# Patient Record
Sex: Female | Born: 1955 | Race: Black or African American | Hispanic: No | Marital: Married | Smoking: Never smoker
Health system: Southern US, Community
[De-identification: ages and names within clinical notes are randomized; demographics above are authoritative.]

## PROBLEM LIST (undated history)

## (undated) DIAGNOSIS — I10 Essential (primary) hypertension: Secondary | ICD-10-CM

## (undated) DIAGNOSIS — E119 Type 2 diabetes mellitus without complications: Secondary | ICD-10-CM

## (undated) HISTORY — PX: TUBAL LIGATION: SHX77

---

## 2012-03-07 ENCOUNTER — Encounter: Payer: Self-pay | Admitting: Emergency Medicine

## 2012-03-07 ENCOUNTER — Emergency Department (INDEPENDENT_AMBULATORY_CARE_PROVIDER_SITE_OTHER)
Admission: EM | Admit: 2012-03-07 | Discharge: 2012-03-07 | Disposition: A | Discharge status: Home / Self Care | Payer: PRIVATE HEALTH INSURANCE | Source: Home / Self Care | Attending: Emergency Medicine | Admitting: Emergency Medicine

## 2012-03-07 DIAGNOSIS — M6530 Trigger finger, unspecified finger: Secondary | ICD-10-CM

## 2012-03-07 HISTORY — DX: Essential (primary) hypertension: I10

## 2012-03-07 MED ORDER — TRAMADOL HCL 50 MG PO TABS: 50.0000 mg | ORAL_TABLET | Freq: Four times a day (QID) | ORAL | Status: AC | PRN

## 2012-03-07 NOTE — ED Notes (Signed)
Right thumb pain started about 6 weeks ago; splint given; worsened over past 24 hours.

## 2012-03-07 NOTE — ED Provider Notes (Signed)
History     CSN: [id number]  Arrival date & time 03/07/12  1730   First MD Initiated Contact with Patient 03/07/12 1744      No chief complaint on file.   (Consider location/radiation/quality/duration/timing/severity/associated sxs/prior treatment) HPI This is a right-handed female patient who presents today with right thumb pain.  She saw a physician about a month ago gave her meloxicam and she's been taking it every day without any success.  She states pain is on the right thumb and sometimes feels that it locks up.  No trauma.  None of her other fingers are affected.  She works for a [employer] and frequently does typing all day long.  She states that her discomfort is intermittent especially when bending her thumb in certain directions and is mild to moderate in severity.  No past medical history on file.  No past surgical history on file.  No family history on file.  History  Substance Use Topics  . Smoking status: Not on file  . Smokeless tobacco: Not on file  . Alcohol Use: Not on file    OB History    No data available      Review of Systems  All other systems reviewed and are negative.    Allergies  Review of patient's allergies indicates not on file.  Home Medications   Current Outpatient Rx  Name Route Sig Dispense Refill  . TRAMADOL HCL 50 MG PO TABS Oral Take 1 tablet (50 mg total) by mouth every 6 (six) hours as needed for pain. 24 tablet 0    There were no vitals taken for this visit.  Physical Exam  Nursing note and vitals reviewed. Constitutional: She is oriented to person, place, and time. She appears well-developed and well-nourished.  HENT:  Head: Normocephalic and atraumatic.  Eyes: No scleral icterus.  Neck: Neck supple.  Cardiovascular: Regular rhythm and normal heart sounds.   Pulmonary/Chest: Effort normal and breath sounds normal. No respiratory distress.  Musculoskeletal:       Right hand and wrist examination demonstrates full range  of motion of all her digits and a normal distal neurovascular status.  Thumb examination shows full range of motion of the IP joint MCP joint and CMC joint.  No snuffbox tenderness.  When flexing the thumb she does have a small trigger point located near the MCP joint on the volar aspect of her thumb.  No swelling or bruising.  No UCL laxity.  Neurological: She is alert and oriented to person, place, and time.  Skin: Skin is warm and dry.  Psychiatric: She has a normal mood and affect. Her speech is normal.    ED Course  Procedures (including critical care time)  Labs Reviewed - No data to display No results found.   1. Trigger finger       MDM   This patient apparently has trigger thumb and I gave her a prescription for tramadol and was going to give her a thumb spica splint but she already had one on her person.  I advised her to ice the area and use her splint for the next few days.  I've sent her to Dr. Pearletha Forge who is a sports medicine physician who can do an ultrasound guided injection into the trigger point.  That should help her more than anything else.  I explained the pathophysiology to the patient and she understands it.  Marlaine Hind, MD 03/07/12 1757

## 2012-03-10 ENCOUNTER — Encounter: Payer: Self-pay | Admitting: Family Medicine

## 2012-03-10 ENCOUNTER — Ambulatory Visit (INDEPENDENT_AMBULATORY_CARE_PROVIDER_SITE_OTHER): Payer: PRIVATE HEALTH INSURANCE | Admitting: Family Medicine

## 2012-03-10 VITALS — BP 126/83 | HR 88 | Temp 98.0°F | Ht 66.0 in | Wt 161.0 lb

## 2012-03-10 DIAGNOSIS — M6530 Trigger finger, unspecified finger: Secondary | ICD-10-CM

## 2012-03-10 NOTE — Assessment & Plan Note (Signed)
Right 1st digit trigger finger - patient has failed bracing to this point, becoming more painful.  Discussed options and went ahead with cortisone injection today.  See instructions for further.  F/u in 1 month for reevaluation.  Consider repeat injection if not improving.  After informed written consent patient was seated on exam table.  Area overlying 1st A1 pulley prepped with alcohol swab and nodule injected with 0.5:0.5 mL marcaine: depomedrol.  Patient tolerated procedure well without immediate complications.

## 2012-03-10 NOTE — Progress Notes (Signed)
  Subjective:    Patient ID: Tammy Joseph, female    DOB: 23-Aug-1956, 56 y.o.   MRN: [id number]  PCP: Dr. Gildardo Griffes  HPI 56 yo F here for right thumb pain.  Patient denies known injury. States for over 2 months has had progressively worsening right thumb pain, stiffness, and catching with motions of IP joint. Does a lot of typing, using hands for work. Is right handed. Has been wearing a thumb spica like brace since March 20th. Went to urgent care recently and referred here. No prior issues with this thumb.  Past Medical History  Diagnosis Date  . Hypertension     Current Outpatient Prescriptions on File Prior to Visit  Medication Sig Dispense Refill  . hydrochlorothiazide (MICROZIDE) 12.5 MG capsule Take by mouth daily.      Marland Kitchen levothyroxine (SYNTHROID, LEVOTHROID) 88 MCG tablet Take 88 mcg by mouth daily.      . meloxicam (MOBIC) 15 MG tablet Take 15 mg by mouth daily.      . traMADol (ULTRAM) 50 MG tablet Take 1 tablet (50 mg total) by mouth every 6 (six) hours as needed for pain.  24 tablet  0    Past Surgical History  Procedure Date  . Tubal ligation     Allergies  Allergen Reactions  . Potassium-Containing Compounds     History   Social History  . Marital Status: Married    Spouse Name: N/A    Number of Children: N/A  . Years of Education: N/A   Occupational History  . Not on file.   Social History Main Topics  . Smoking status: Never Smoker   . Smokeless tobacco: Not on file  . Alcohol Use: No  . Drug Use: No  . Sexually Active:    Other Topics Concern  . Not on file   Social History Narrative  . No narrative on file    Family History  Problem Relation Age of Onset  . Hypertension Sister   . Hypertension Brother   . Heart failure Brother   . Heart attack Brother   . Hyperlipidemia Brother   . Diabetes Maternal Aunt   . Sudden death Neg Hx     BP 126/83  Pulse 88  Temp(Src) 98 F (36.7 C) (Oral)  Ht 5\' 6"  (1.676 m)  Wt  161 lb (73.029 kg)  BMI 25.99 kg/m2  Review of Systems See HPI above.    Objective:   Physical Exam Gen: NAD  R hand: Palpable nodule at A1 pulley location of 1st digit.  No other swelling, deformity.  No malrotation or angulation of thumb. TTP at nodule noted above.  No other TTP throughout digits. Painful flexion of IP joint but full extension. Collateral ligaments intact of 1st MCP and IP joint. NVI distally.     Assessment & Plan:  1. Right 1st digit trigger finger - patient has failed bracing to this point, becoming more painful.  Discussed options and went ahead with cortisone injection today.  See instructions for further.  F/u in 1 month for reevaluation.  Consider repeat injection if not improving.  After informed written consent patient was seated on exam table.  Area overlying 1st A1 pulley prepped with alcohol swab and nodule injected with 0.5:0.5 mL marcaine: depomedrol.  Patient tolerated procedure well without immediate complications.

## 2012-06-10 ENCOUNTER — Encounter: Payer: Self-pay | Admitting: Family Medicine

## 2012-06-10 ENCOUNTER — Ambulatory Visit (INDEPENDENT_AMBULATORY_CARE_PROVIDER_SITE_OTHER): Payer: PRIVATE HEALTH INSURANCE | Admitting: Family Medicine

## 2012-06-10 VITALS — BP 121/78 | HR 82 | Ht 66.0 in | Wt 154.0 lb

## 2012-06-10 DIAGNOSIS — M6530 Trigger finger, unspecified finger: Secondary | ICD-10-CM

## 2012-06-10 NOTE — Assessment & Plan Note (Signed)
Right 1st digit trigger finger - Excellent relief with previous injection but recurred after almost 3 months.  She would like to go ahead with trigger finger injection again which was done today.  Continue bracing as needed.  If still not enough relief with this would refer to hand surgeon for discussion of possible surgical intervention.  After informed written consent patient was seated on exam table.  Area overlying 1st A1 pulley prepped with alcohol swab and nodule injected with 0.5:0.5 mL marcaine: depomedrol.  Patient tolerated procedure well without immediate complications.

## 2012-06-10 NOTE — Progress Notes (Signed)
Subjective:    Patient ID: Tammy Joseph, female    DOB: February 01, 1956, 56 y.o.   MRN: [id number]  PCP: Dr. Gildardo Griffes  Hand Pain    56 yo F here for f/u right thumb pain.  4/30: Patient denies known injury. States for over 2 months has had progressively worsening right thumb pain, stiffness, and catching with motions of IP joint. Does a lot of typing, using hands for work. Is right handed. Has been wearing a thumb spica like brace since March 20th. Went to urgent care recently and referred here. No prior issues with this thumb.  7/31: Patient reports significant relief with injection for trigger thumb until about 1 week ago when pain started to recur as well as stiffness. Some swelling. Knot has returned at base of thumb palm side. No other pain about hand. No new injury. No other complaints.  Past Medical History  Diagnosis Date  . Hypertension     Current Outpatient Prescriptions on File Prior to Visit  Medication Sig Dispense Refill  . hydrochlorothiazide (MICROZIDE) 12.5 MG capsule Take by mouth daily.      Marland Kitchen levothyroxine (SYNTHROID, LEVOTHROID) 88 MCG tablet Take 88 mcg by mouth daily.      . meloxicam (MOBIC) 15 MG tablet Take 15 mg by mouth daily.        Past Surgical History  Procedure Date  . Tubal ligation     Allergies  Allergen Reactions  . Potassium-Containing Compounds     History   Social History  . Marital Status: Married    Spouse Name: N/A    Number of Children: N/A  . Years of Education: N/A   Occupational History  . Not on file.   Social History Main Topics  . Smoking status: Never Smoker   . Smokeless tobacco: Not on file  . Alcohol Use: No  . Drug Use: No  . Sexually Active: Not on file   Other Topics Concern  . Not on file   Social History Narrative  . No narrative on file    Family History  Problem Relation Age of Onset  . Hypertension Sister   . Hypertension Brother   . Heart failure Brother   . Heart  attack Brother   . Hyperlipidemia Brother   . Diabetes Maternal Aunt   . Sudden death Neg Hx     BP 121/78  Pulse 82  Ht 5\' 6"  (1.676 m)  Wt 154 lb (69.854 kg)  BMI 24.86 kg/m2  Review of Systems  See HPI above.    Objective:   Physical Exam  Gen: NAD  R hand: Palpable nodule at A1 pulley location of 1st digit.  No other swelling, deformity.  No malrotation or angulation of thumb. TTP at nodule noted above.  No other TTP throughout digits. Painful flexion of IP joint but full extension. Collateral ligaments intact of 1st MCP and IP joint. NVI distally.     Assessment & Plan:  1. Right 1st digit trigger finger - Excellent relief with previous injection but recurred after almost 3 months.  She would like to go ahead with trigger finger injection again which was done today.  Continue bracing as needed.  If still not enough relief with this would refer to hand surgeon for discussion of possible surgical intervention.  After informed written consent patient was seated on exam table.  Area overlying 1st A1 pulley prepped with alcohol swab and nodule injected with 0.5:0.5 mL marcaine: depomedrol.  Patient tolerated procedure  well without immediate complications.

## 2016-01-07 ENCOUNTER — Emergency Department (INDEPENDENT_AMBULATORY_CARE_PROVIDER_SITE_OTHER)
Admission: EM | Admit: 2016-01-07 | Discharge: 2016-01-07 | Disposition: A | Discharge status: Home / Self Care | Payer: BLUE CROSS/BLUE SHIELD | Source: Home / Self Care | Attending: Family Medicine | Admitting: Family Medicine

## 2016-01-07 ENCOUNTER — Emergency Department (INDEPENDENT_AMBULATORY_CARE_PROVIDER_SITE_OTHER): Payer: PRIVATE HEALTH INSURANCE

## 2016-01-07 DIAGNOSIS — M542 Cervicalgia: Secondary | ICD-10-CM

## 2016-01-07 DIAGNOSIS — W1839XA Other fall on same level, initial encounter: Secondary | ICD-10-CM | POA: Diagnosis not present

## 2016-01-07 DIAGNOSIS — W19XXXA Unspecified fall, initial encounter: Secondary | ICD-10-CM

## 2016-01-07 DIAGNOSIS — S0990XA Unspecified injury of head, initial encounter: Secondary | ICD-10-CM

## 2016-01-07 DIAGNOSIS — M546 Pain in thoracic spine: Secondary | ICD-10-CM

## 2016-01-07 DIAGNOSIS — W010XXA Fall on same level from slipping, tripping and stumbling without subsequent striking against object, initial encounter: Secondary | ICD-10-CM

## 2016-01-07 DIAGNOSIS — R110 Nausea: Secondary | ICD-10-CM | POA: Diagnosis not present

## 2016-01-07 DIAGNOSIS — M545 Low back pain: Secondary | ICD-10-CM | POA: Diagnosis not present

## 2016-01-07 DIAGNOSIS — M47812 Spondylosis without myelopathy or radiculopathy, cervical region: Secondary | ICD-10-CM | POA: Diagnosis not present

## 2016-01-07 HISTORY — DX: Type 2 diabetes mellitus without complications: E11.9

## 2016-01-07 NOTE — Discharge Instructions (Signed)
You may take 400-600mg Ibuprofen (Motrin) every 6-8 hours for pain  °Alternate with Tylenol  °You may take 500mg Tylenol every 4-6 hours as needed for pain  ° ° °

## 2016-01-07 NOTE — ED Provider Notes (Signed)
CSN: [id number]     Arrival date & time 01/07/16  1109 History   First MD Initiated Contact with Patient 01/07/16 1119     Chief Complaint  Patient presents with  . Fall   (Consider location/radiation/quality/duration/timing/severity/associated sxs/prior Treatment) HPI The pt is a 60yo female presenting to [facility] with c/o neck and back pain after she tripped and fell around 4PM.  She was putting papers away in a file cabinet when she lost her footing and fell backward, hitting her head on a chair but denies LOC.  Right side of her neck is most painful as well as below her Right shoulder blade. Pain is aching and sore, worse with certain positions.  She did take ibuprofen and a hot bath which provided moderate relief but she woke this morning with a mild headache, nausea and dizziness. Denies change in vision, change in balance or vomiting. Denies numbness in arms or legs. She is not on blood thinners.   Past Medical History  Diagnosis Date  . Hypertension   . Diabetes mellitus without complication [facility])    Past Surgical History  Procedure Laterality Date  . Tubal ligation     Family History  Problem Relation Age of Onset  . Hypertension Sister   . Hypertension Brother   . Heart failure Brother   . Heart attack Brother   . Hyperlipidemia Brother   . Diabetes Maternal Aunt   . Sudden death Neg Hx    Social History  Substance Use Topics  . Smoking status: Never Smoker   . Smokeless tobacco: None  . Alcohol Use: No   OB History    No data available     Review of Systems  Eyes: Negative for photophobia and visual disturbance.  Gastrointestinal: Positive for nausea. Negative for vomiting.  Musculoskeletal: Positive for myalgias, back pain and arthralgias. Negative for gait problem.  Skin: Negative for color change and wound.  Neurological: Positive for dizziness and headaches. Negative for weakness, light-headedness and numbness.    Allergies  Potassium-containing  compounds  Home Medications   Prior to Admission medications   Medication Sig Start Date End Date Taking? Authorizing Provider  hydrochlorothiazide (MICROZIDE) 12.5 MG capsule Take by mouth daily.    Historical Provider, MD  levothyroxine (SYNTHROID, LEVOTHROID) 88 MCG tablet Take 88 mcg by mouth daily.    Historical Provider, MD  meloxicam (MOBIC) 15 MG tablet Take 15 mg by mouth daily.    Historical Provider, MD   Meds Ordered and Administered this Visit  Medications - No data to display  BP 125/83 mmHg  Pulse 84  Temp(Src) 98 F (36.7 C) (Oral)  Ht  (1.676 m)  Wt 162 lb 12.8 oz (73.846 kg)  BMI 26.29 kg/m2  SpO2 96% No data found.   Physical Exam  Constitutional: She appears well-developed and well-nourished. No distress.  HENT:  Head: Normocephalic and atraumatic.  Eyes: Conjunctivae are normal. No scleral icterus.  Neck: Normal range of motion. Neck supple.  Tenderness to Right side of base of skull and Right cervical muscles. Full ROM with mild pain on full flexion.   Cardiovascular: Normal rate, regular rhythm and normal heart sounds.   Pulmonary/Chest: Effort normal and breath sounds normal. No respiratory distress. She has no wheezes. She has no rales. She exhibits no tenderness.  Abdominal: Soft. She exhibits no distension. There is no tenderness.  Musculoskeletal: Normal range of motion.  Tenderness along middle thoracic spine and lower lumbar spine. Full ROM upper and lower  extremities with 5/5 strength.   Neurological: She is alert.  Normal sensation in arms and legs. Normal gait.   Skin: Skin is warm and dry. She is not diaphoretic.  Nursing note and vitals reviewed.   ED Course  Procedures (including critical care time)  Labs Review Labs Reviewed - No data to display  Imaging Review Dg Cervical Spine Complete  01/07/2016  CLINICAL DATA:  Fall yesterday, neck injury, posterior pain EXAM: CERVICAL SPINE - COMPLETE 4+ VIEW COMPARISON:  None.  FINDINGS: Degenerative spurring at C5-6. Normal alignment. Prevertebral soft tissues are normal. No fracture or subluxation. IMPRESSION: No acute bony abnormality. Electronically Signed   By: Charlett Nose M.D.   On: 01/07/2016 12:08   Dg Thoracic Spine W/swimmers  01/07/2016  CLINICAL DATA:  Fall yesterday.  Posterior upper back pain. EXAM: THORACIC SPINE - 3 VIEWS COMPARISON:  None. FINDINGS: There is no evidence of thoracic spine fracture. Alignment is normal. No other significant bone abnormalities are identified. IMPRESSION: Negative. Electronically Signed   By: Charlett Nose M.D.   On: 01/07/2016 12:09   Dg Lumbar Spine Complete  01/07/2016  CLINICAL DATA:  Patient status post fall with lower back injury and pain radiating to the right side. Initial encounter. EXAM: LUMBAR SPINE - COMPLETE 4+ VIEW COMPARISON:  None. FINDINGS: Normal anatomic alignment. No evidence for acute fracture or dislocation. Preservation of the vertebral body and intervertebral disc space heights. No significant degenerative changes. SI joints are unremarkable. Pelvic phleboliths. Stool throughout the colon. IMPRESSION: No acute osseous abnormality. Stool throughout the colon as can be seen with constipation. Electronically Signed   By: Annia Belt M.D.   On: 01/07/2016 12:12      MDM   1. Degenerative arthritis of cervical spine   2. Neck pain   3. Fall from slip, trip, or stumble, initial encounter   4. Fall   5. Nausea without vomiting   6. Head injury, initial encounter    Pt c/o neck pain and back pain as well as mild dizziness and nausea after trip and fall yesterday afternoon. Normal neuro exam.    Plain films: significant for degenerative spurring at C5-6 but no acute findings.   Discussed imaging with pt.  No CT of head indicated at this time, however, she appears to have a mild concussion based on reports of mild dizziness and nausea.  Pt declined pain medication in UC and prescription pain medication  including muscle relaxers. Encouraged to alternate ice and heat. May take acetaminophen and ibuprofen. Home care instructions provided. Discussed signs and symptoms to pt and husband that warrant further evaluation in emergency department.  F/u with PCP or Sports Medicine in 1-2 weeks if neck/back pain not improving. Patient verbalized understanding and agreement with treatment plan.     Junius Finner, PA-C 01/07/16 1446

## 2016-01-07 NOTE — ED Notes (Signed)
Fell yesterday around 4pm.  Hit head on chair in 2 places, hit from head to upper legs.  Says that right side of neck is extremely painful.  Did not lose consciousness, but was dizzy and nauseated after falling.  Took ibuprofen yesterday and a hot bath.  Woke up this morning with headache, nausea, dizziness.

## 2016-12-14 IMAGING — CR DG THORACIC SPINE 3V
3 series · 3 of 3 positions shown · non-contrast
Comparison: None.

CLINICAL DATA: Fall yesterday.  Posterior upper back pain.

EXAM:
THORACIC SPINE - 3 VIEWS

[t-spine ap]
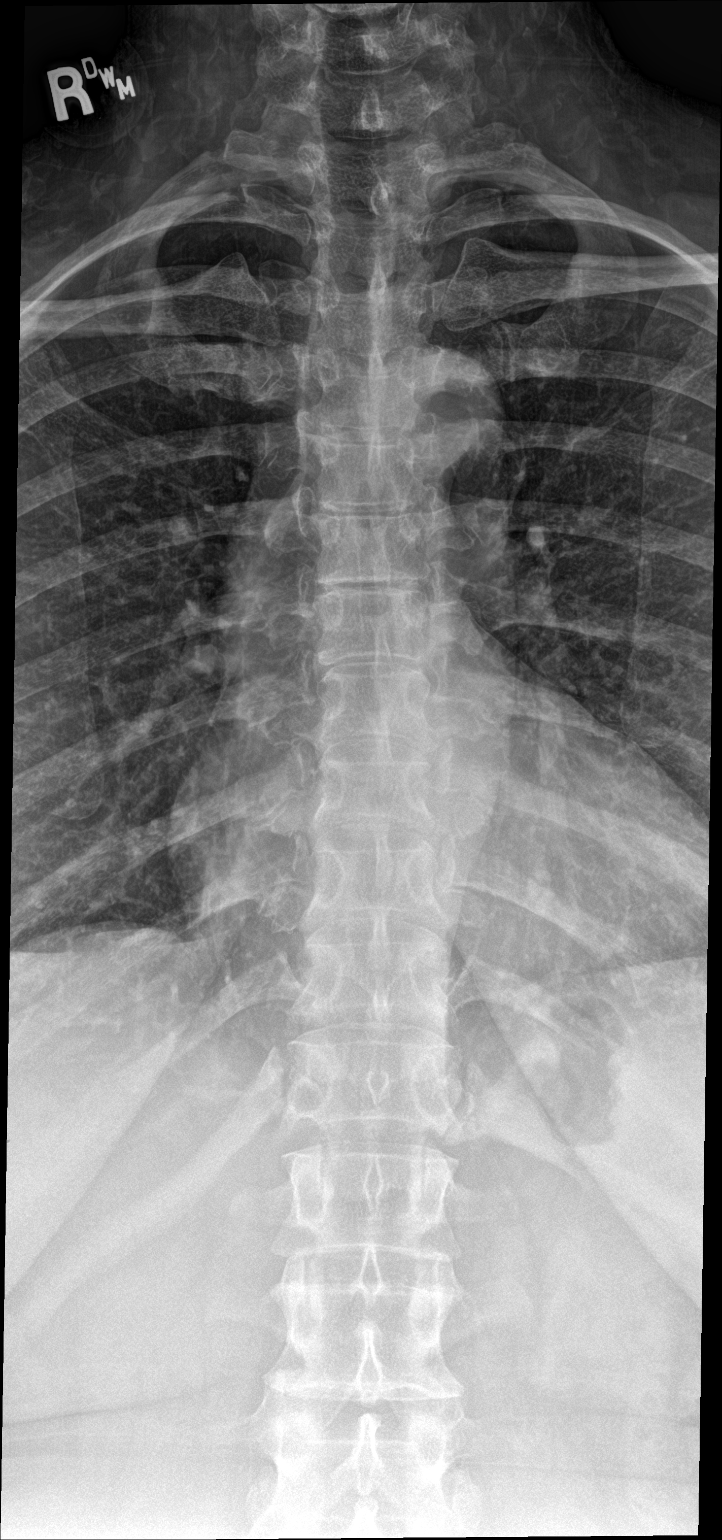

[t-spine lat]
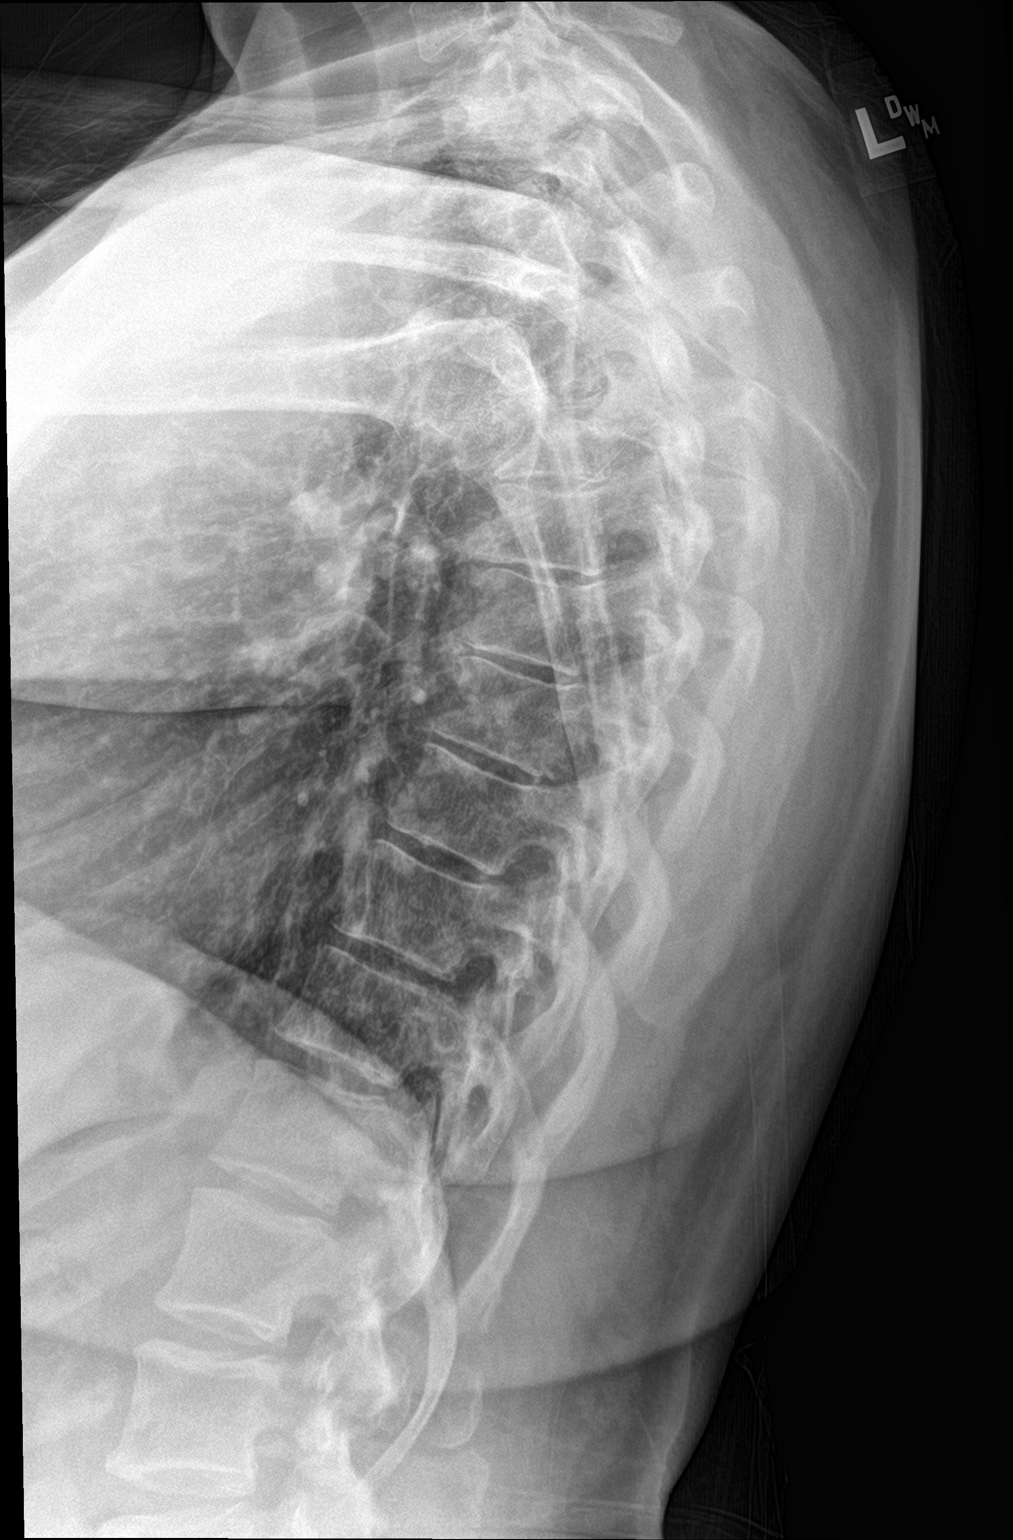

[t-spine swimmers]
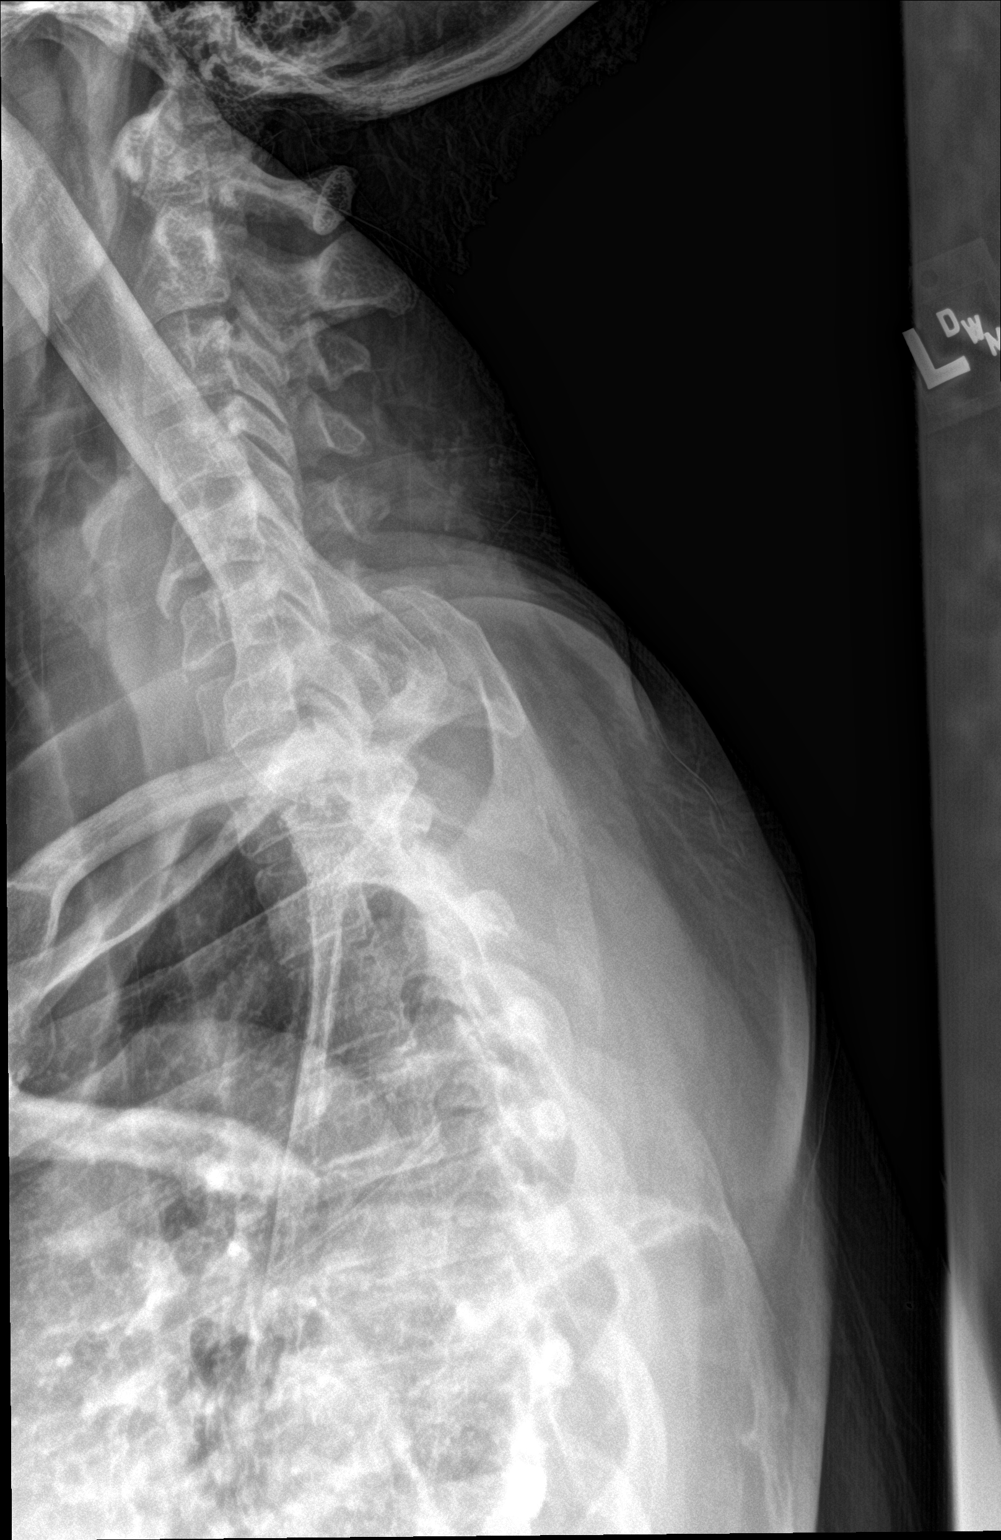

[3 of 3 positions shown; findings below may reference images not displayed]

FINDINGS: There is no evidence of thoracic spine fracture. Alignment is
normal. No other significant bone abnormalities are identified.
IMPRESSION: Negative.
